# Patient Record
Sex: Female | Born: 1935 | Race: White | Hispanic: No | Marital: Married | Smoking: Never smoker
Health system: Southern US, Community
[De-identification: ages and names within clinical notes are randomized; demographics above are authoritative.]

## PROBLEM LIST (undated history)

## (undated) DIAGNOSIS — E785 Hyperlipidemia, unspecified: Secondary | ICD-10-CM

## (undated) HISTORY — PX: TONSILLECTOMY: SUR1361

## (undated) HISTORY — PX: APPENDECTOMY: SHX54

## (undated) HISTORY — DX: Hyperlipidemia, unspecified: E78.5

## (undated) HISTORY — PX: ABDOMINAL HYSTERECTOMY: SHX81

---

## 2014-05-22 ENCOUNTER — Emergency Department (HOSPITAL_BASED_OUTPATIENT_CLINIC_OR_DEPARTMENT_OTHER)
Admission: EM | Admit: 2014-05-22 | Discharge: 2014-05-22 | Disposition: A | Discharge status: Home / Self Care | Payer: Medicare Other | Attending: Emergency Medicine | Admitting: Emergency Medicine

## 2014-05-22 ENCOUNTER — Emergency Department (HOSPITAL_BASED_OUTPATIENT_CLINIC_OR_DEPARTMENT_OTHER): Payer: Medicare Other

## 2014-05-22 ENCOUNTER — Encounter (HOSPITAL_BASED_OUTPATIENT_CLINIC_OR_DEPARTMENT_OTHER): Payer: Self-pay | Admitting: *Deleted

## 2014-05-22 DIAGNOSIS — M19011 Primary osteoarthritis, right shoulder: Secondary | ICD-10-CM | POA: Insufficient documentation

## 2014-05-22 DIAGNOSIS — M19019 Primary osteoarthritis, unspecified shoulder: Secondary | ICD-10-CM

## 2014-05-22 DIAGNOSIS — Z880 Allergy status to penicillin: Secondary | ICD-10-CM | POA: Insufficient documentation

## 2014-05-22 DIAGNOSIS — M25511 Pain in right shoulder: Secondary | ICD-10-CM | POA: Insufficient documentation

## 2014-05-22 DIAGNOSIS — M25519 Pain in unspecified shoulder: Secondary | ICD-10-CM

## 2014-05-22 MED ORDER — TIZANIDINE HCL 2 MG PO TABS: 2.0000 mg | ORAL_TABLET | Freq: Three times a day (TID) | ORAL | Status: AC | PRN

## 2014-05-22 MED ORDER — HYDROCODONE-ACETAMINOPHEN 5-325 MG PO TABS
2.0000 | ORAL_TABLET | Freq: Once | ORAL | Status: AC
  Administered 2014-05-22: 2 via ORAL
  Filled 2014-05-22: qty 2

## 2014-05-22 MED ORDER — CYCLOBENZAPRINE HCL 10 MG PO TABS
10.0000 mg | ORAL_TABLET | Freq: Once | ORAL | Status: AC
  Administered 2014-05-22: 10 mg via ORAL
  Filled 2014-05-22: qty 1

## 2014-05-22 MED ORDER — IBUPROFEN 600 MG PO TABS: 600.0000 mg | ORAL_TABLET | Freq: Four times a day (QID) | ORAL | Status: AC | PRN

## 2014-05-22 MED ORDER — HYDROCODONE-ACETAMINOPHEN 5-325 MG PO TABS: 1.0000 | ORAL_TABLET | Freq: Four times a day (QID) | ORAL | Status: AC | PRN

## 2014-05-22 NOTE — ED Notes (Signed)
Patient transported to X-ray 

## 2014-05-22 NOTE — ED Provider Notes (Signed)
CSN: [id number]     Arrival date & time 05/22/14  7829 History   First MD Initiated Contact with Patient 05/22/14 [phone]     Chief Complaint  Patient presents with  . Shoulder Pain    right     (Consider location/radiation/quality/duration/timing/severity/associated sxs/prior Treatment) Patient is a 81 y.o. female presenting with shoulder pain. The history is provided by the patient.  Shoulder Pain Location:  Shoulder Injury: no   Shoulder location:  R shoulder Pain details:    Quality:  Aching   Radiates to:  Does not radiate   Severity:  Mild   Onset quality:  Sudden   Timing:  Constant   Progression:  Unchanged Chronicity:  New Handedness:  Right-handed Dislocation: yes   Relieved by:  Nothing Worsened by:  Movement (unable to abduct arm anteriorly) Ineffective treatments:  None tried Associated symptoms: decreased range of motion and muscle weakness   Associated symptoms: no back pain, no fever, no neck pain, no numbness, no stiffness, no swelling and no tingling     History reviewed. No pertinent past medical history. Past Surgical History  Procedure Laterality Date  . Appendectomy    . Tonsillectomy    . Abdominal hysterectomy     No family history on file. History  Substance Use Topics  . Smoking status: Never Smoker   . Smokeless tobacco: Not on file  . Alcohol Use: Yes     Comment: occassionally   OB History    No data available     Review of Systems  Constitutional: Negative for fever.  Respiratory: Negative for cough and shortness of breath.   Musculoskeletal: Negative for back pain, stiffness and neck pain.  All other systems reviewed and are negative.     Allergies  Penicillins  Home Medications   Prior to Admission medications   Medication Sig Start Date End Date Taking? Authorizing Provider  Calcium Carbonate (CALTRATE 600 PO) Take by mouth.   Yes Historical Provider, MD  lovastatin (MEVACOR) 10 MG tablet Take 10 mg by mouth at  bedtime.   Yes Historical Provider, MD  Multiple Vitamins-Minerals (MULTIVITAL PO) Take by mouth.   Yes Historical Provider, MD   BP 153/70 mmHg  Pulse 63  Temp(Src) 98.8 F (37.1 C) (Oral)  Resp 18  Ht  (1.6 m)  Wt 160 lb (72.576 kg)  BMI 28.35 kg/m2  SpO2 99% Physical Exam  Constitutional: She is oriented to person, place, and time. She appears well-developed and well-nourished. No distress.  HENT:  Head: Normocephalic and atraumatic.  Mouth/Throat: Oropharynx is clear and moist.  Eyes: EOM are normal. Pupils are equal, round, and reactive to light.  Neck: Normal range of motion. Neck supple.  Cardiovascular: Normal rate and regular rhythm.  Exam reveals no friction rub.   No murmur heard. Pulmonary/Chest: Effort normal and breath sounds normal. No respiratory distress. She has no wheezes. She has no rales.  Abdominal: Soft. She exhibits no distension. There is no tenderness. There is no rebound.  Musculoskeletal: She exhibits no edema.       Right shoulder: She exhibits decreased range of motion, tenderness and deformity (anterior deltoid swelling). She exhibits no bony tenderness, normal pulse and normal strength.  Neurological: She is alert and oriented to person, place, and time.  Skin: No rash noted. She is not diaphoretic.  Nursing note and vitals reviewed.   ED Course  Procedures (including critical care time) Labs Review Labs Reviewed - No data to display  Imaging  Review Dg Shoulder Right  05/22/2014   CLINICAL DATA:  Right shoulder pain.  Remote shoulder injury.  EXAM: RIGHT SHOULDER - 2+ VIEW  COMPARISON:  None.  FINDINGS: Severe glenohumeral osteoarthritis with bone-on-bone contact, spurring, and sclerosis. There is chronic impaction of the humeral head into the acromion consistent with chronic complete rotator cuff tear. No evidence of acute fracture or dislocation. A 3.6 cm long corticated bone fragment anterior to the shoulder on axillary imaging has an  unusual shape for intra-articular body, favor a remote distracted fracture fragment.  IMPRESSION: 1. No acute osseous findings. 2. Chronic complete rotator cuff tear with severe glenohumeral osteoarthritis. 3. Large ossified fragment anterior to the glenohumeral joint, favor remote fracture fragment.   Electronically Signed   By: Marnee SpringJonathon  Watts M.D.   On: 05/22/2014 09:24     EKG Interpretation None      MDM   Final diagnoses:  Shoulder pain  Shoulder arthritis    81 year old female with right shoulder pain. Has been waking up with her right arm overhead for the past few days. She's been able to get it down and continue normal activity for the past few days. Today was worse and had deformity of the right shoulder. She is unable to anteriorly abduct her shoulder. No chest pain. No numbness or tingling in the arm. She does have decreased range of motion right shoulder but does have good pulses and good sensation. No major deficits suggesting inferior dislocation of the shoulder, but does have some anterior deltoid swelling. Possibly dislocated, will xray. Pain meds and flexeril given.  Xray with no dislocation. Has rotator cuff tear and old fracture, which could be causing her discomfort.  Given sports med f/u, given pain meds, muscle relaxers, short course of NSAIDs.  I have reviewed all labs and imaging and considered them in my medical decision making.   Elwin MochaBlair Xara Paulding, MD 05/22/14 [phone]

## 2014-05-22 NOTE — ED Notes (Signed)
Patient states she has an old injury to her right shoulder , 43 years ago, from a fall, dud not.  States she woke up this morning and had been sleeping with both arms above her head.  States when she sleeps like this, she uses her left hand to lower her right arm.  States this morning she could not lower her right shoulder and now has pain and deformity.

## 2014-05-22 NOTE — Discharge Instructions (Signed)

## 2014-05-24 ENCOUNTER — Ambulatory Visit (INDEPENDENT_AMBULATORY_CARE_PROVIDER_SITE_OTHER): Payer: Medicare Other | Admitting: Family Medicine

## 2014-05-24 DIAGNOSIS — M25511 Pain in right shoulder: Secondary | ICD-10-CM

## 2014-05-24 NOTE — Patient Instructions (Signed)
You suffered a shoulder subluxation though you also have severe underlying arthritis and a (likely) chronic rotator cuff tear. Do codman exercises next 2 weeks (arm circles, arm swings, and table slides) - 3 sets of 10 except table slides which are 1 set of 10 once a day. Tylenol and/or ibuprofen as needed for pain. Icing 15 minutes at a time 3-4 times a day. Consider cortisone injection for the arthritis component. Consider physical therapy in the future. I would recommend follow-up in 2 weeks when you return to [place].

## 2014-05-27 ENCOUNTER — Encounter: Payer: Self-pay | Admitting: Family Medicine

## 2014-05-27 DIAGNOSIS — M25511 Pain in right shoulder: Secondary | ICD-10-CM | POA: Insufficient documentation

## 2014-05-27 NOTE — Assessment & Plan Note (Signed)
based on mechanism, appearance following walking the dog and shoulder 'going back into place' it sounds like she had a shoulder subluxation in addition to her underlying severe arthritis.  No new injury to suggest an acute fracture (old fracture on radiographs).  Shown codman exercises to do daily.  Tylenol or ibuprofen as needed.  Icing as needed.  Offered injection, PT which she declined at this time.  F/u in 2 weeks when she returns to [place].

## 2014-05-27 NOTE — Progress Notes (Signed)
PCP: No PCP Per Patient  Subjective:   HPI: Patient is a 81 y.o. female here for right shoulder pain.  Patient reports she's had limited motion of her right shoulder and some pain for past 43 years. Then reports about 2 days ago while walking the dog it was pulling her forward a lot. Developed a lot of pain as a result. Tried tylenol, motrin, muscle relaxant. Per daughter it looked like shoulder was deformed. Before patient had her x-rays it felt like the shoulder went back into place. Is right handed. No prior treatment for this shoulder by a clinic, physician. Going back to [place] in a week.  Past Medical History  Diagnosis Date  . Hyperlipidemia     Current Outpatient Prescriptions on File Prior to Visit  Medication Sig Dispense Refill  . Calcium Carbonate (CALTRATE 600 PO) Take by mouth.    Marland Kitchen. HYDROcodone-acetaminophen (NORCO/VICODIN) 5-325 MG per tablet Take 1 tablet by mouth every 6 (six) hours as needed for moderate pain. 30 tablet 0  . ibuprofen (ADVIL,MOTRIN) 600 MG tablet Take 1 tablet (600 mg total) by mouth every 6 (six) hours as needed. 21 tablet 0  . lovastatin (MEVACOR) 10 MG tablet Take 10 mg by mouth at bedtime.    . Multiple Vitamins-Minerals (MULTIVITAL PO) Take by mouth.    Marland Kitchen. tiZANidine (ZANAFLEX) 2 MG tablet Take 1 tablet (2 mg total) by mouth every 8 (eight) hours as needed for muscle spasms. 21 tablet 0   No current facility-administered medications on file prior to visit.    Past Surgical History  Procedure Laterality Date  . Appendectomy    . Tonsillectomy    . Abdominal hysterectomy      Allergies  Allergen Reactions  . Penicillins Diarrhea    History   Social History  . Marital Status: Married    Spouse Name: N/A  . Number of Children: N/A  . Years of Education: N/A   Occupational History  . Not on file.   Social History Main Topics  . Smoking status: Never Smoker   . Smokeless tobacco: Not on file  . Alcohol Use: Yes   Comment: occassionally  . Drug Use: No  . Sexual Activity: Not on file   Other Topics Concern  . Not on file   Social History Narrative    No family history on file.  There were no vitals taken for this visit.  Review of Systems: See HPI above.    Objective:  Physical Exam:  Gen: NAD  Right shoulder: No swelling, ecchymoses.  No gross deformity. TTP anterior through posterior shoulder. ROM limited to 20 degrees ER, 100 degrees flexion, 90 degrees abduction - all painful. Pain with Ananias PilgrimHawkins, Neers. Negative Yergasons. Strength 4/5 with empty can and resisted internal/external rotation.  Some pain all motions. Negative sulcus.  Unable to position for apprehension. NV intact distally.    Assessment & Plan:  1. Right shoulder pain - based on mechanism, appearance following walking the dog and shoulder 'going back into place' it sounds like she had a shoulder subluxation in addition to her underlying severe arthritis.  No new injury to suggest an acute fracture (old fracture on radiographs).  Shown codman exercises to do daily.  Tylenol or ibuprofen as needed.  Icing as needed.  Offered injection, PT which she declined at this time.  F/u in 2 weeks when she returns to [place].

## 2016-03-02 IMAGING — CR DG SHOULDER 2+V*R*
3 series · 3 of 3 positions shown · non-contrast
Comparison: None.

CLINICAL DATA: Right shoulder pain.  Remote shoulder injury.

EXAM:
RIGHT SHOULDER - 2+ VIEW

[w shoulder ap internal righ *]
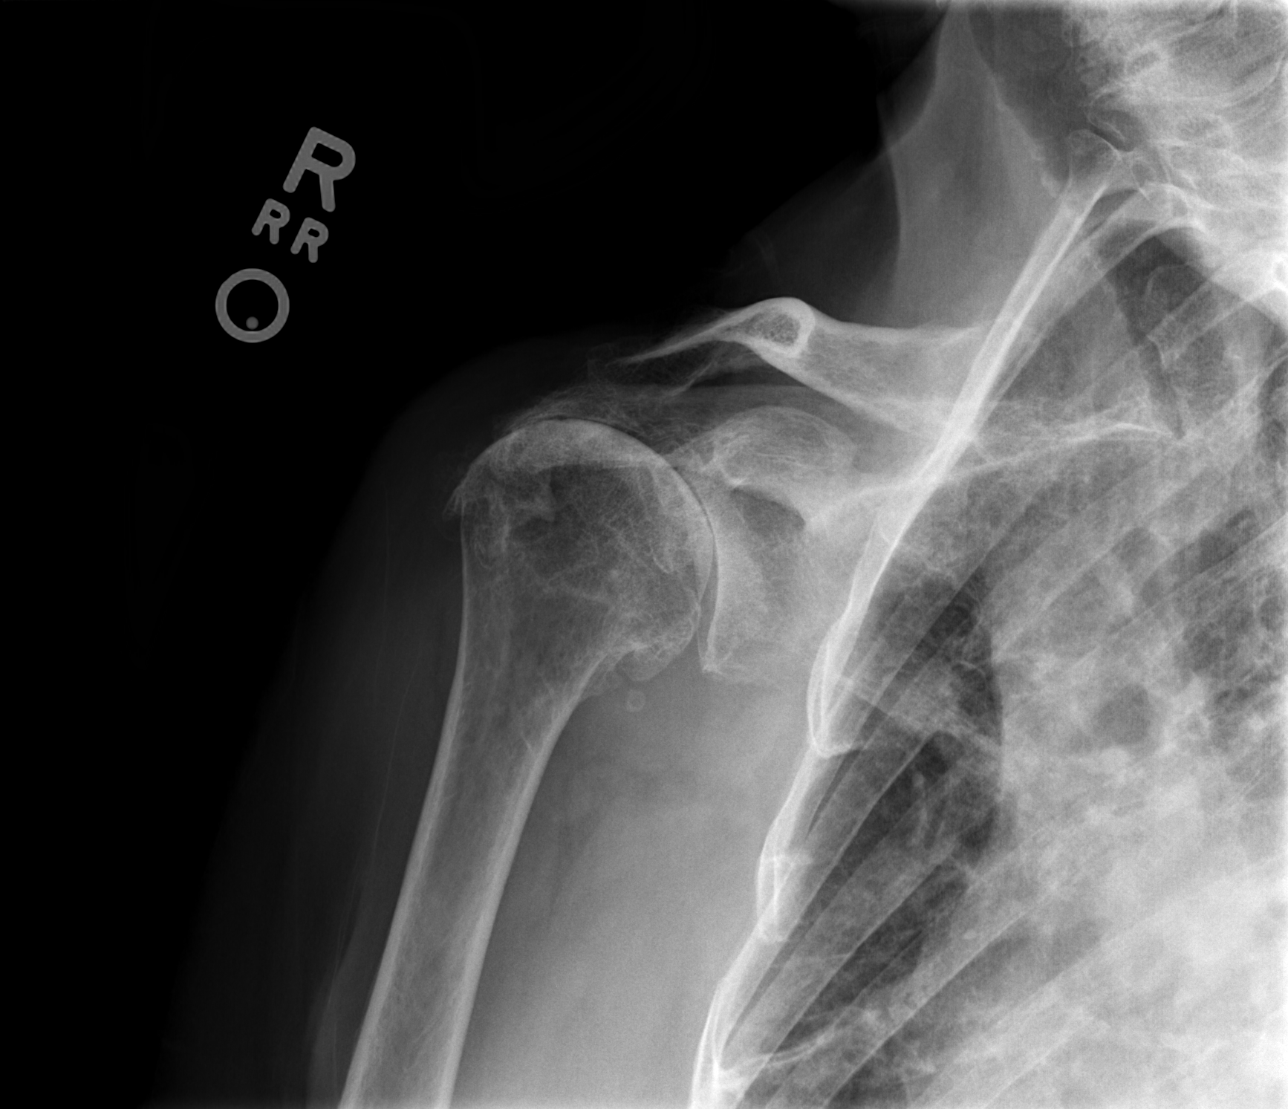

[w shoulder ap external righ]
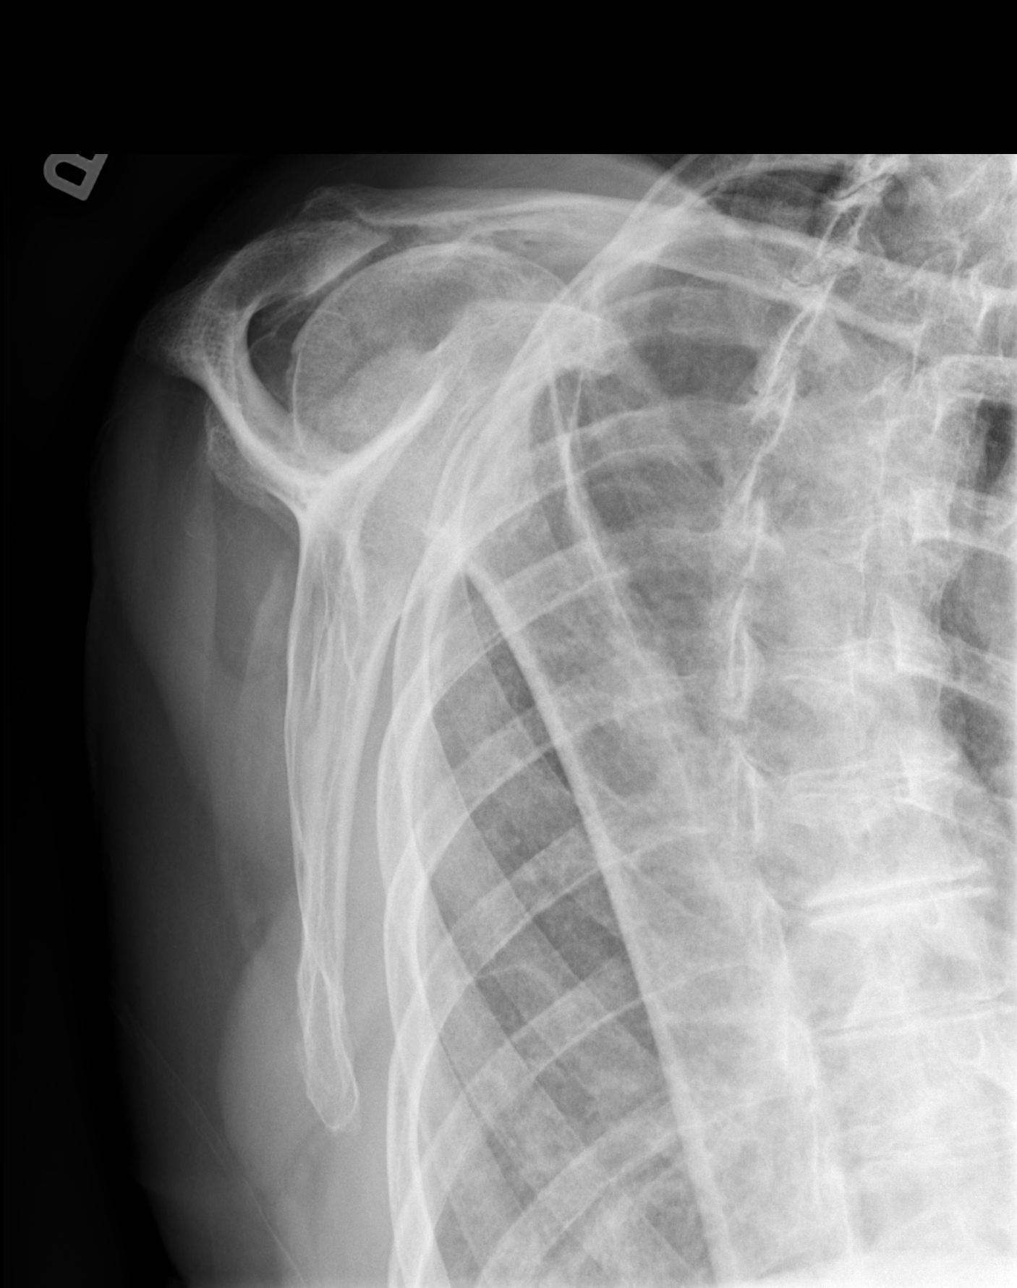

[x shoulder axillary right *]
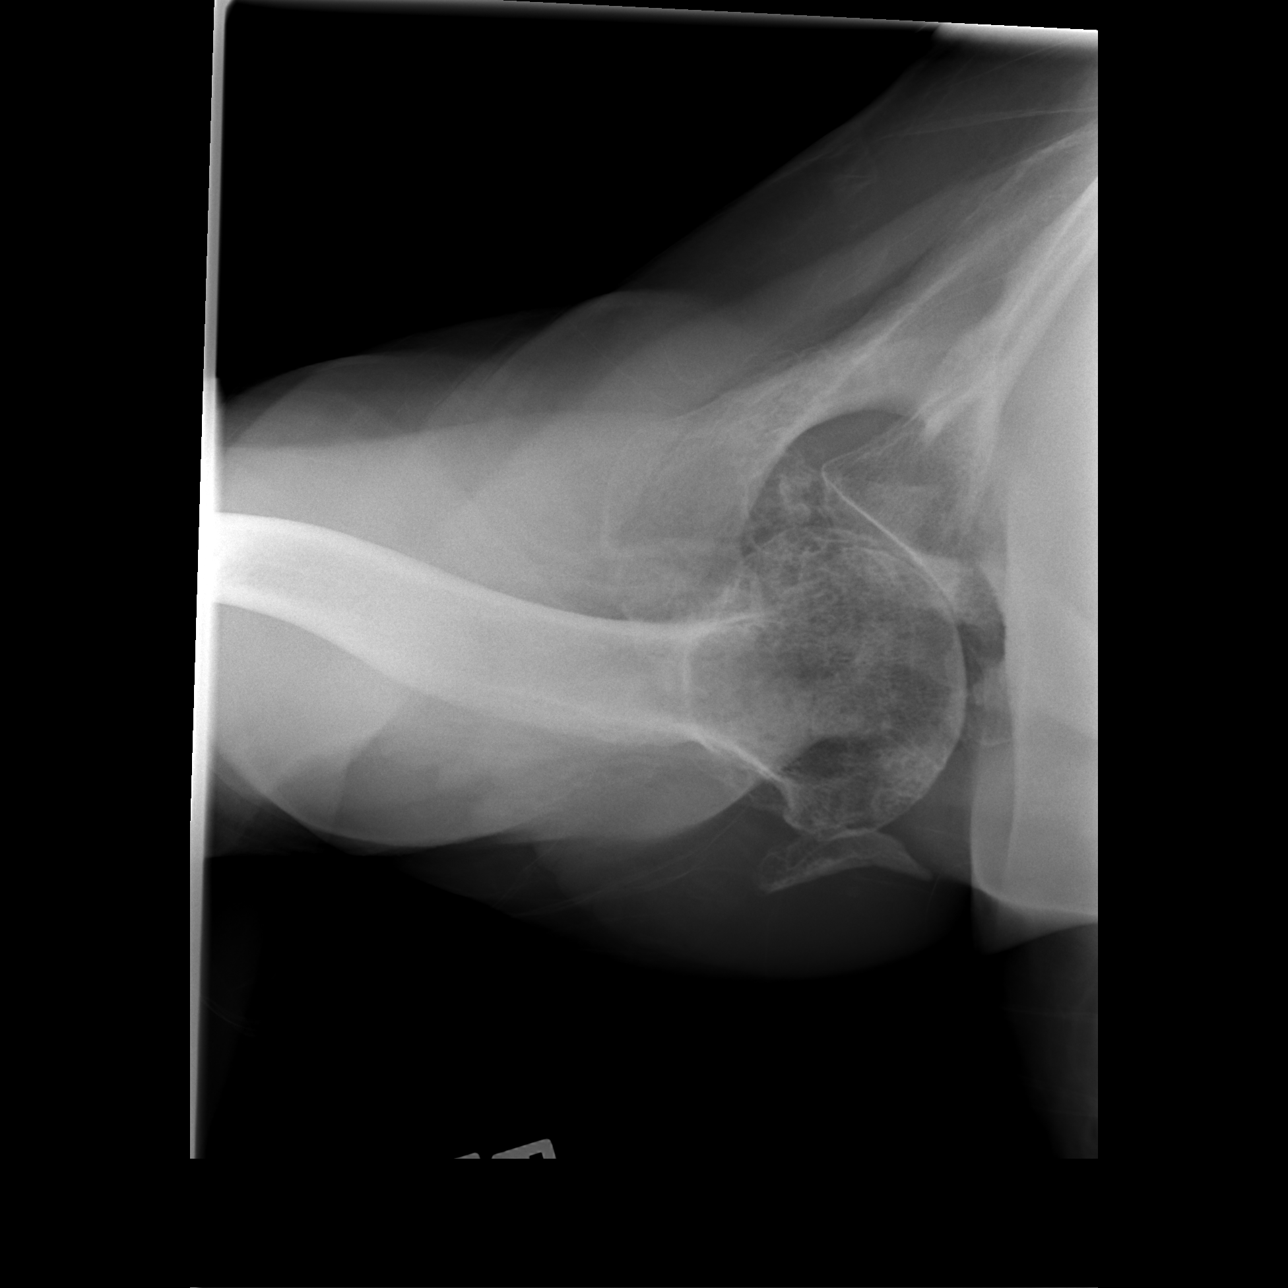

[3 of 3 positions shown; findings below may reference images not displayed]

FINDINGS: Severe glenohumeral osteoarthritis with bone-on-bone contact,
spurring, and sclerosis. There is chronic impaction of the humeral
head into the acromion consistent with chronic complete rotator cuff
tear. No evidence of acute fracture or dislocation. A 3.6 cm long
corticated bone fragment anterior to the shoulder on axillary
imaging has an unusual shape for intra-articular body, favor a
remote distracted fracture fragment.
IMPRESSION: 1. No acute osseous findings.
2. Chronic complete rotator cuff tear with severe glenohumeral
osteoarthritis.
3. Large ossified fragment anterior to the glenohumeral joint, favor
remote fracture fragment.
